# Patient Record
Sex: Female | Born: 1995 | Race: Black or African American | Hispanic: No | Marital: Single | Smoking: Current some day smoker
Health system: Southern US, Community
[De-identification: ages and names within clinical notes are randomized; demographics above are authoritative.]

## PROBLEM LIST (undated history)

## (undated) DIAGNOSIS — J45909 Unspecified asthma, uncomplicated: Secondary | ICD-10-CM

---

## 2019-08-19 ENCOUNTER — Other Ambulatory Visit: Payer: Self-pay | Admitting: Cardiology

## 2019-08-19 DIAGNOSIS — Z20822 Contact with and (suspected) exposure to covid-19: Secondary | ICD-10-CM

## 2019-08-20 LAB — NOVEL CORONAVIRUS, NAA: SARS-CoV-2, NAA: NOT DETECTED

## 2019-09-03 ENCOUNTER — Ambulatory Visit (HOSPITAL_COMMUNITY): Admission: EM | Admit: 2019-09-03 | Discharge: 2019-09-03 | Discharge status: Absent without leave | Payer: Self-pay

## 2019-10-13 ENCOUNTER — Other Ambulatory Visit: Payer: Self-pay

## 2019-10-13 ENCOUNTER — Emergency Department (HOSPITAL_COMMUNITY)
Admission: EM | Admit: 2019-10-13 | Discharge: 2019-10-13 | Disposition: A | Discharge status: Home / Self Care | Payer: 59 | Attending: Emergency Medicine | Admitting: Emergency Medicine

## 2019-10-13 ENCOUNTER — Encounter (HOSPITAL_COMMUNITY): Payer: Self-pay | Admitting: Emergency Medicine

## 2019-10-13 ENCOUNTER — Emergency Department (HOSPITAL_COMMUNITY): Admission: EM | Admit: 2019-10-13 | Discharge: 2019-10-13 | Discharge status: Against Medical Advice | Payer: 59

## 2019-10-13 DIAGNOSIS — F17210 Nicotine dependence, cigarettes, uncomplicated: Secondary | ICD-10-CM | POA: Diagnosis not present

## 2019-10-13 DIAGNOSIS — L03115 Cellulitis of right lower limb: Secondary | ICD-10-CM | POA: Diagnosis not present

## 2019-10-13 DIAGNOSIS — M79661 Pain in right lower leg: Secondary | ICD-10-CM | POA: Diagnosis present

## 2019-10-13 MED ORDER — CEPHALEXIN 500 MG PO CAPS: 500.0000 mg | ORAL_CAPSULE | Freq: Four times a day (QID) | ORAL | 0 refills | Status: AC

## 2019-10-13 MED ORDER — CEPHALEXIN 500 MG PO CAPS
500.0000 mg | ORAL_CAPSULE | Freq: Once | ORAL | Status: AC
  Administered 2019-10-13: 500 mg via ORAL
  Filled 2019-10-13: qty 1

## 2019-10-13 NOTE — Discharge Instructions (Addendum)
Take antibiotics as prescribed.  Take entire course, even if your symptoms improve.  You received your first dose of the antibiotic in the ER, your next dose will be tomorrow morning. Use Tylenol or ibuprofen as needed for pain. Return to the emergency room if you develop high fevers, persistent vomiting, severe worsening pain, symptoms are worsening despite antibiotic treatment after 48 hours, or with any new, worsening, or concerning symptoms.

## 2019-10-13 NOTE — ED Triage Notes (Signed)
Patient here from home reporting  redness and pain to right lower shin that started this week.  No drainage noted.

## 2019-10-13 NOTE — ED Provider Notes (Signed)
[facility] DEPT Provider Note   CSN: [id number] Arrival date & time: 10/13/19  0108     History Chief Complaint  Patient presents with  . Leg Pain    Ann Hickman is a 24 y.o. female presenting for evaluation of right shin pain and redness.  Patient states yesterday she noted some discomfort of her right shin.  Since then, she has had continued discomfort, describes it as a stinging and burning feeling.  She has some redness and swelling.  Patient states she has a scab that she consistently picks at on the shin that has been present for years.  However the discomfort and redness is new.  She denies known fevers, chills, nausea, vomiting, or generalized weakness.  No confusion.  She has no rash or discomfort elsewhere.  She has no other medical problems, takes medications daily.  She has been applying hydrogen peroxide and first-aid cream without improvement of symptoms.  She has not taken anything else.  HPI     History reviewed. No pertinent past medical history.  There are no problems to display for this patient.   History reviewed. No pertinent surgical history.   OB History   No obstetric history on file.     No family history on file.  Social History   Tobacco Use  . Smoking status: Current Some Day Smoker  . Smokeless tobacco: Never Used  Substance Use Topics  . Alcohol use: Yes  . Drug use: Yes    Types: Marijuana    Home Medications Prior to Admission medications   Medication Sig Start Date End Date Taking? Authorizing Provider  cephALEXin (KEFLEX) 500 MG capsule Take 1 capsule (500 mg total) by mouth 4 (four) times daily for 7 days. 10/13/19 10/20/19  Quanta Roher, PA-C    Allergies    Patient has no known allergies.  Review of Systems   Review of Systems  Skin: Positive for rash.  Neurological: Negative for weakness.    Physical Exam Updated Vital Signs BP 126/78 (BP Location: Right Arm)   Pulse 98   Temp (!)  100.4 F (38 C) (Oral)   Resp 19   SpO2 100%   Physical Exam Vitals and nursing note reviewed.  Constitutional:      General: She is not in acute distress.    Appearance: She is well-developed.     Comments: Resting comfortably in the bed in no acute distress  HENT:     Head: Normocephalic and atraumatic.  Eyes:     Conjunctiva/sclera: Conjunctivae normal.     Pupils: Pupils are equal, round, and reactive to light.  Cardiovascular:     Rate and Rhythm: Normal rate and regular rhythm.  Pulmonary:     Effort: Pulmonary effort is normal. No respiratory distress.     Breath sounds: Normal breath sounds. No wheezing.  Abdominal:     General: There is no distension.  Musculoskeletal:        General: Normal range of motion.     Cervical back: Normal range of motion and neck supple.  Skin:    General: Skin is warm and dry.     Capillary Refill: Capillary refill takes less than 2 seconds.     Findings: Erythema present.     Comments: Area of erythema and warmth of the anterior right shin.  Central area consistent with chronic scab.  No streaking.  Compartments are soft.  Pedal pulses 2+ bilaterally.  Neurological:     Mental Status:  She is alert and oriented to person, place, and time.     ED Results / Procedures / Treatments   Labs (all labs ordered are listed, but only abnormal results are displayed) Labs Reviewed - No data to display  EKG None  Radiology No results found.  Procedures Procedures (including critical care time)  Medications Ordered in ED Medications - No data to display  ED Course  I have reviewed the triage vital signs and the nursing notes.  Pertinent labs & imaging results that were available during my care of the patient were reviewed by me and considered in my medical decision making (see chart for details).    MDM Rules/Calculators/A&P                      Patient presenting for evaluation of right shin infection.  Physical exam shows  patient appears nontoxic.  She has mild fever of 100.4 upon arrival to the ED.  She does not appear septic.  Exam is consistent with cellulitis, likely due to skin as she has been picking.  Will treat with antibiotics.  At this time, patient appears safe for discharge.  Return precautions given.  Patient states she understands and agrees to plan.  Final Clinical Impression(s) / ED Diagnoses Final diagnoses:  Cellulitis of right lower extremity    Rx / DC Orders ED Discharge Orders         Ordered    cephALEXin (KEFLEX) 500 MG capsule  4 times daily     10/13/19 0158           Alveria Apley, PA-C 10/13/19 0159    Zadie Rhine, MD 10/13/19 [phone]

## 2019-10-13 NOTE — ED Triage Notes (Addendum)
Patient reports redness/tender with soreness at right lower shin this week , no drainage , denies fever or chills .

## 2019-10-17 ENCOUNTER — Encounter (HOSPITAL_COMMUNITY): Payer: Self-pay | Admitting: Emergency Medicine

## 2019-10-17 ENCOUNTER — Emergency Department (HOSPITAL_COMMUNITY): Payer: 59

## 2019-10-17 ENCOUNTER — Emergency Department (HOSPITAL_COMMUNITY)
Admission: EM | Admit: 2019-10-17 | Discharge: 2019-10-17 | Disposition: A | Discharge status: Home / Self Care | Payer: 59 | Attending: Emergency Medicine | Admitting: Emergency Medicine

## 2019-10-17 ENCOUNTER — Other Ambulatory Visit: Payer: Self-pay

## 2019-10-17 DIAGNOSIS — F419 Anxiety disorder, unspecified: Secondary | ICD-10-CM | POA: Diagnosis not present

## 2019-10-17 DIAGNOSIS — F17200 Nicotine dependence, unspecified, uncomplicated: Secondary | ICD-10-CM | POA: Insufficient documentation

## 2019-10-17 DIAGNOSIS — R0789 Other chest pain: Secondary | ICD-10-CM | POA: Insufficient documentation

## 2019-10-17 DIAGNOSIS — R079 Chest pain, unspecified: Secondary | ICD-10-CM

## 2019-10-17 DIAGNOSIS — J45909 Unspecified asthma, uncomplicated: Secondary | ICD-10-CM | POA: Diagnosis not present

## 2019-10-17 DIAGNOSIS — R0602 Shortness of breath: Secondary | ICD-10-CM | POA: Diagnosis not present

## 2019-10-17 DIAGNOSIS — R2243 Localized swelling, mass and lump, lower limb, bilateral: Secondary | ICD-10-CM | POA: Insufficient documentation

## 2019-10-17 DIAGNOSIS — R000 Tachycardia, unspecified: Secondary | ICD-10-CM | POA: Insufficient documentation

## 2019-10-17 LAB — CBC WITH DIFFERENTIAL/PLATELET
Abs Immature Granulocytes: 0.03 10*3/uL (ref 0.00–0.07)
Basophils Absolute: 0 10*3/uL (ref 0.0–0.1)
Basophils Relative: 0 %
Eosinophils Absolute: 0.2 10*3/uL (ref 0.0–0.5)
Eosinophils Relative: 1 %
HCT: 36.9 % (ref 36.0–46.0)
Hemoglobin: 11.1 g/dL — ABNORMAL LOW (ref 12.0–15.0)
Immature Granulocytes: 0 %
Lymphocytes Relative: 19 %
Lymphs Abs: 2.3 10*3/uL (ref 0.7–4.0)
MCH: 24.7 pg — ABNORMAL LOW (ref 26.0–34.0)
MCHC: 30.1 g/dL (ref 30.0–36.0)
MCV: 82.2 fL (ref 80.0–100.0)
Monocytes Absolute: 0.6 10*3/uL (ref 0.1–1.0)
Monocytes Relative: 5 %
Neutro Abs: 9.2 10*3/uL — ABNORMAL HIGH (ref 1.7–7.7)
Neutrophils Relative %: 75 %
Platelets: 309 10*3/uL (ref 150–400)
RBC: 4.49 MIL/uL (ref 3.87–5.11)
RDW: 14.8 % (ref 11.5–15.5)
WBC: 12.4 10*3/uL — ABNORMAL HIGH (ref 4.0–10.5)
nRBC: 0 % (ref 0.0–0.2)

## 2019-10-17 LAB — BASIC METABOLIC PANEL
Anion gap: 10 (ref 5–15)
BUN: 10 mg/dL (ref 6–20)
CO2: 24 mmol/L (ref 22–32)
Calcium: 9.1 mg/dL (ref 8.9–10.3)
Chloride: 102 mmol/L (ref 98–111)
Creatinine, Ser: 0.78 mg/dL (ref 0.44–1.00)
GFR calc Af Amer: >60 mL/min (ref >60)
GFR calc non Af Amer: >60 mL/min (ref >60)
Glucose, Bld: 126 mg/dL — ABNORMAL HIGH (ref 70–99)
Potassium: 4 mmol/L (ref 3.5–5.1)
Sodium: 136 mmol/L (ref 135–145)

## 2019-10-17 LAB — I-STAT BETA HCG BLOOD, ED (MC, WL, AP ONLY): I-stat hCG, quantitative: <5 m[IU]/mL (ref <5)

## 2019-10-17 LAB — D-DIMER, QUANTITATIVE: D-Dimer, Quant: 0.61 ug/mL-FEU — ABNORMAL HIGH (ref 0.00–0.50)

## 2019-10-17 LAB — TROPONIN I (HIGH SENSITIVITY): Troponin I (High Sensitivity): <2 ng/L (ref <18)

## 2019-10-17 MED ORDER — SODIUM CHLORIDE (PF) 0.9 % IJ SOLN
INTRAMUSCULAR | Status: AC
  Filled 2019-10-17: qty 50

## 2019-10-17 MED ORDER — IOHEXOL 350 MG/ML SOLN
100.0000 mL | Freq: Once | INTRAVENOUS | Status: AC | PRN
  Administered 2019-10-17: 100 mL via INTRAVENOUS

## 2019-10-17 NOTE — ED Provider Notes (Signed)
[facility]-EMERGENCY DEPT Provider Note   CSN: [id number] Arrival date & time: 10/17/19  0253     History Chief Complaint  Patient presents with  . Flank Pain    Ann Hickman is a 24 y.o. female.  The history is provided by the patient and medical records.   24 year old female with history of asthma, presenting to the ED with right-sided chest pain.  Reports this began last evening but has been intensifying since onset.  States whenever she takes a deep breath she feels like a sharp, gripping sensation in the right side of her chest only.  States this will relax some but never fully subsides.  She denies any radiation of pain into her right neck or right arm.  States she does feel mildly short of breath.  She is not had any cough, fever, or other infectious symptoms.  She denies any recent travel or prolonged immobilization.  She did previously have a Nexplanon, this was removed in January.  She denies any history of DVT or PE.  No prior cardiac history.  She is not a smoker.  Patient was recently seen here for possible skin infection of right shin, has been taking keflex as prescribed.  History reviewed. No pertinent past medical history.  There are no problems to display for this patient.   History reviewed. No pertinent surgical history.   OB History   No obstetric history on file.     History reviewed. No pertinent family history.  Social History   Tobacco Use  . Smoking status: Current Some Day Smoker  . Smokeless tobacco: Never Used  Substance Use Topics  . Alcohol use: Yes  . Drug use: Yes    Types: Marijuana    Home Medications Prior to Admission medications   Medication Sig Start Date End Date Taking? Authorizing Provider  cephALEXin (KEFLEX) 500 MG capsule Take 1 capsule (500 mg total) by mouth 4 (four) times daily for 7 days. 10/13/19 10/20/19  Caccavale, Sophia, PA-C    Allergies    Patient has no known allergies.  Review of Systems     Review of Systems  Cardiovascular: Positive for chest pain.  All other systems reviewed and are negative.   Physical Exam Updated Vital Signs BP (!) 127/91 (BP Location: Left Arm)   Pulse (!) 113   Temp 98.8 F (37.1 C) (Oral)   Resp 20   Ht 5\' 7"  (1.702 m)   Wt 111.1 kg   SpO2 100%   BMI 38.37 kg/m   Physical Exam Vitals and nursing note reviewed.  Constitutional:      Appearance: She is well-developed.  HENT:     Head: Normocephalic and atraumatic.  Eyes:     Conjunctiva/sclera: Conjunctivae normal.     Pupils: Pupils are equal, round, and reactive to light.  Cardiovascular:     Rate and Rhythm: Normal rate and regular rhythm.     Heart sounds: Normal heart sounds.  Pulmonary:     Effort: Pulmonary effort is normal.     Breath sounds: Normal breath sounds.  Chest:     Comments: Chest wall is nontender Abdominal:     General: Bowel sounds are normal.     Palpations: Abdomen is soft.  Musculoskeletal:        General: Normal range of motion.     Cervical back: Normal range of motion.     Comments: Scabbed over area of right shin, appears old, no noted erythema or warmth to  touch present Trace ankle edema bilaterally  Skin:    General: Skin is warm and dry.  Neurological:     Mental Status: She is alert and oriented to person, place, and time.  Psychiatric:     Comments: Tearful, appears anxious     ED Results / Procedures / Treatments   Labs (all labs ordered are listed, but only abnormal results are displayed) Labs Reviewed  CBC WITH DIFFERENTIAL/PLATELET - Abnormal; Notable for the following components:      Result Value   WBC 12.4 (*)    Hemoglobin 11.1 (*)    MCH 24.7 (*)    Neutro Abs 9.2 (*)    All other components within normal limits  BASIC METABOLIC PANEL - Abnormal; Notable for the following components:   Glucose, Bld 126 (*)    All other components within normal limits  D-DIMER, QUANTITATIVE (NOT AT [facility]) - Abnormal; Notable for the  following components:   D-Dimer, Quant 0.61 (*)    All other components within normal limits  I-STAT BETA HCG BLOOD, ED ([facility], [facility], [facility] ONLY)  TROPONIN I (HIGH SENSITIVITY)    EKG None  Radiology DG Chest 2 View  Result Date: 10/17/2019 CLINICAL DATA:  24 year old female with history of right-sided chest pain and tachycardia. EXAM: CHEST - 2 VIEW COMPARISON:  No priors. FINDINGS: Lung volumes are normal. No consolidative airspace disease. No pleural effusions. No pneumothorax. No pulmonary nodule or mass noted. Pulmonary vasculature and the cardiomediastinal silhouette are within normal limits. IMPRESSION: No radiographic evidence of acute cardiopulmonary disease. Electronically Signed   By: Trudie Reed M.D.   On: 10/17/2019 04:24   CT Angio Chest PE W and/or Wo Contrast  Result Date: 10/17/2019 CLINICAL DATA:  Shortness of breath, right pleuritic chest pain, tachycardia EXAM: CT ANGIOGRAPHY CHEST WITH CONTRAST TECHNIQUE: Multidetector CT imaging of the chest was performed using the standard protocol during bolus administration of intravenous contrast. Multiplanar CT image reconstructions and MIPs were obtained to evaluate the vascular anatomy. CONTRAST:  OMNIPAQUE IOHEXOL 350 MG/ML SOLN COMPARISON:  None. FINDINGS: Cardiovascular: Satisfactory opacification of the pulmonary arteries to the proximal segmental level. No evidence of pulmonary embolism. Normal heart size. No pericardial effusion. Mediastinum/Nodes: No adenopathy. Imaged thyroid is unremarkable. Esophagus is unremarkable. Lungs/Pleura: Central airways are patent. Possible trace right pleural effusion. Mild bibasilar atelectasis. No pneumothorax. Upper Abdomen: No acute abnormality. Musculoskeletal: No chest wall abnormality. No significant osseous finding. Review of the MIP images confirms the above findings. IMPRESSION: No evidence of acute pulmonary embolism. Mild bibasilar atelectasis.  Possible trace right pleural fluid.  Electronically Signed   By: Guadlupe Spanish M.D.   On: 10/17/2019 06:41    Procedures Procedures (including critical care time)  Medications Ordered in ED Medications - No data to display  ED Course  I have reviewed the triage vital signs and the nursing notes.  Pertinent labs & imaging results that were available during my care of the patient were reviewed by me and considered in my medical decision making (see chart for details).    MDM Rules/Calculators/A&P  24 y.o. F here with pleuritic right sided chest pain, worse with deep breathing and associated SOB.  She is afebrile, non-toxic.  She is tearful and appears a little anxious.  She is tachycardic and tachypneic but lung sounds are grossly clear.  Recently had nexplanon removed, no other form of birth control in use currently.  No hx of DVT or PE.  Will obtain EKG, screening labs including  d-dimer, and CXR.  5:52 AM Labs grossly reassuring aside from mildly elevated d-dimer.  CXR clear.  On reassessment, patient sleeping and in NAD.  Tachycardia has resolved, HR now within the 80's.  Will get CTA given elevated d-dimer.  If this is without acute findings, can likely discharge home.  This was discussed with patient, she acknowledged understanding and is comfortable with this plan.  CTA negative for acute PE.  Patient continues resting comfortably with stable vital signs on room air.  Feel she is stable for discharge home.  I recommended that she follow-up with her primary care doctor or the [facility] if any ongoing issues.  She was given copies of her results from today's visit for physician review if needed.  She will return here for any new or acute changes.  Final Clinical Impression(s) / ED Diagnoses Final diagnoses:  Chest pain in adult    Rx / DC Orders ED Discharge Orders    None       Larene Pickett, PA-C 10/17/19 3329    Ezequiel Essex, MD 10/17/19 [phone]

## 2019-10-17 NOTE — ED Triage Notes (Signed)
Patient complaining of right flank pain. Patient states it started last night.

## 2019-10-17 NOTE — Discharge Instructions (Signed)
Work-up today was reassuring.  No evidence of heart issue, blood clot, or infection. Results attached on back, Recommend to follow-up with your primary care doctor or student health center if any ongoing issues. Return here for any new/acute changes.

## 2020-06-08 IMAGING — CR DG CHEST 2V
2 series · 2 of 2 positions shown · non-contrast
Comparison: No priors.

CLINICAL DATA: 23-year-old female with history of right-sided chest
pain and tachycardia.

EXAM:
CHEST - 2 VIEW

[w chest pa]
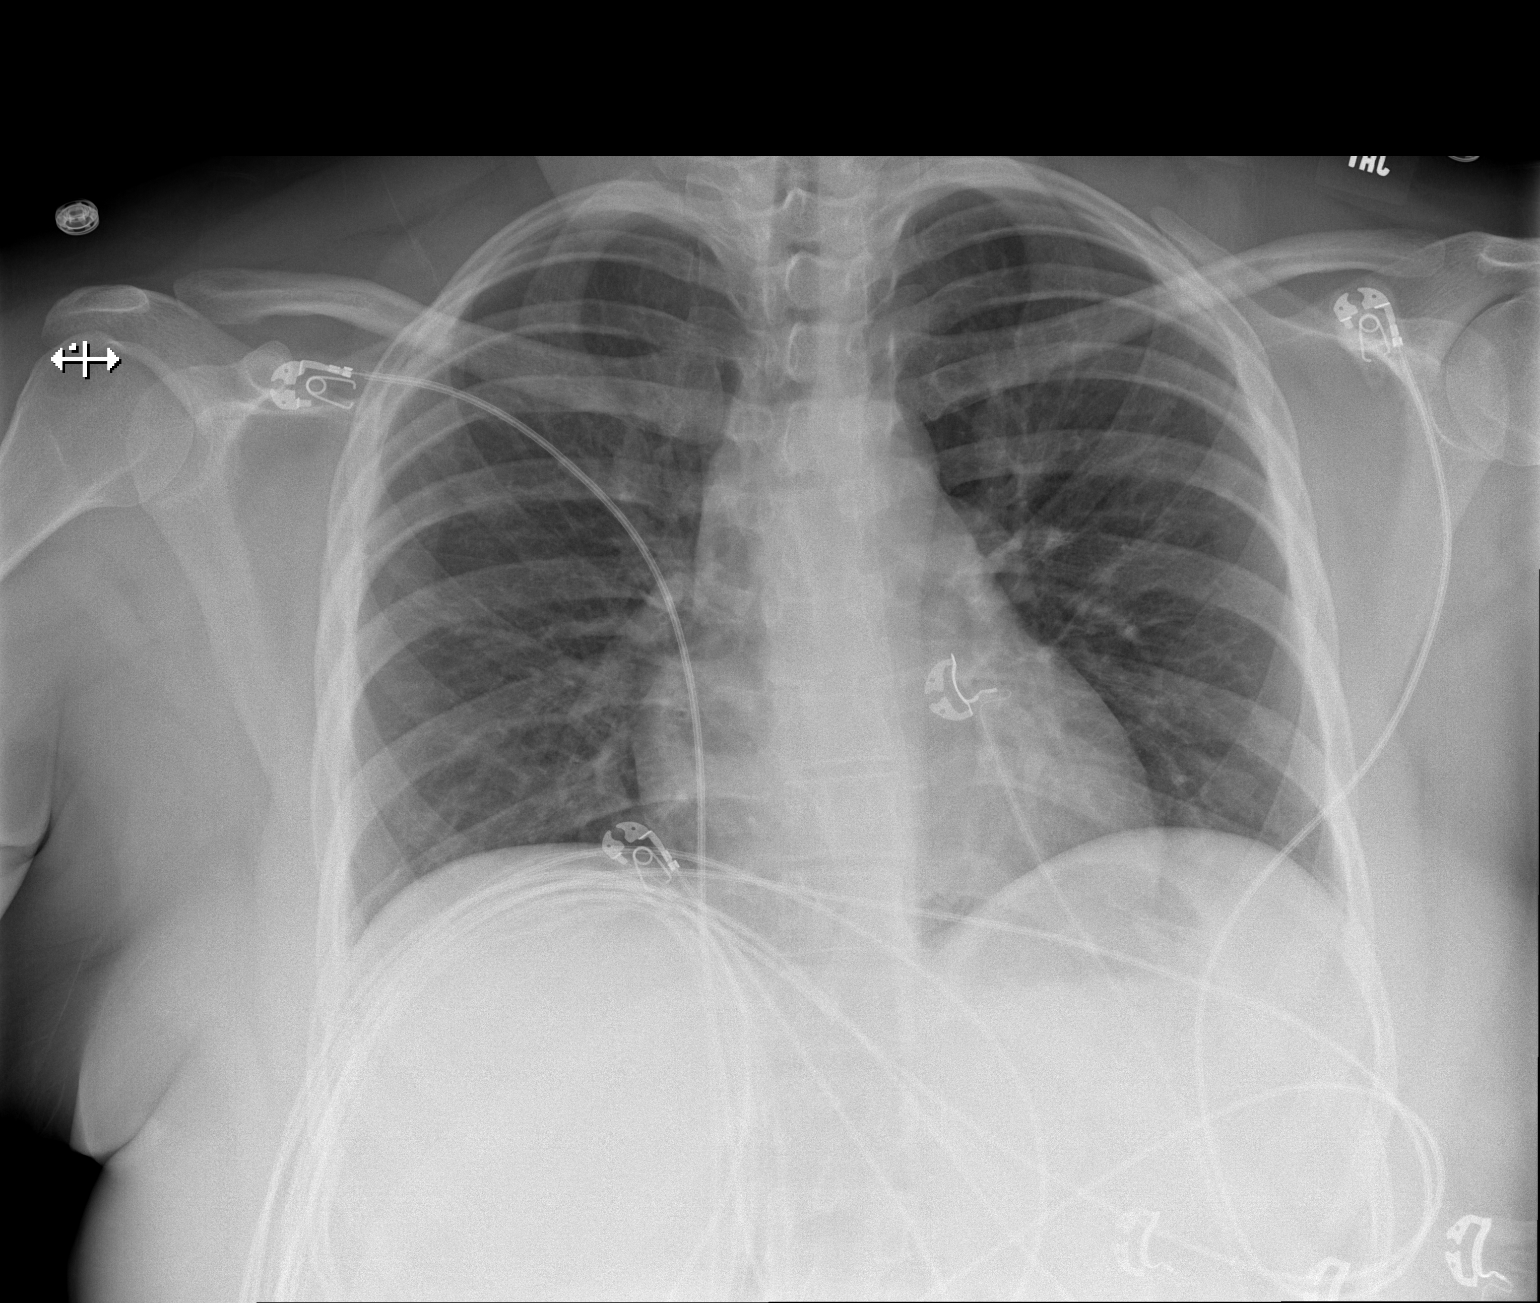

[w chest lat]
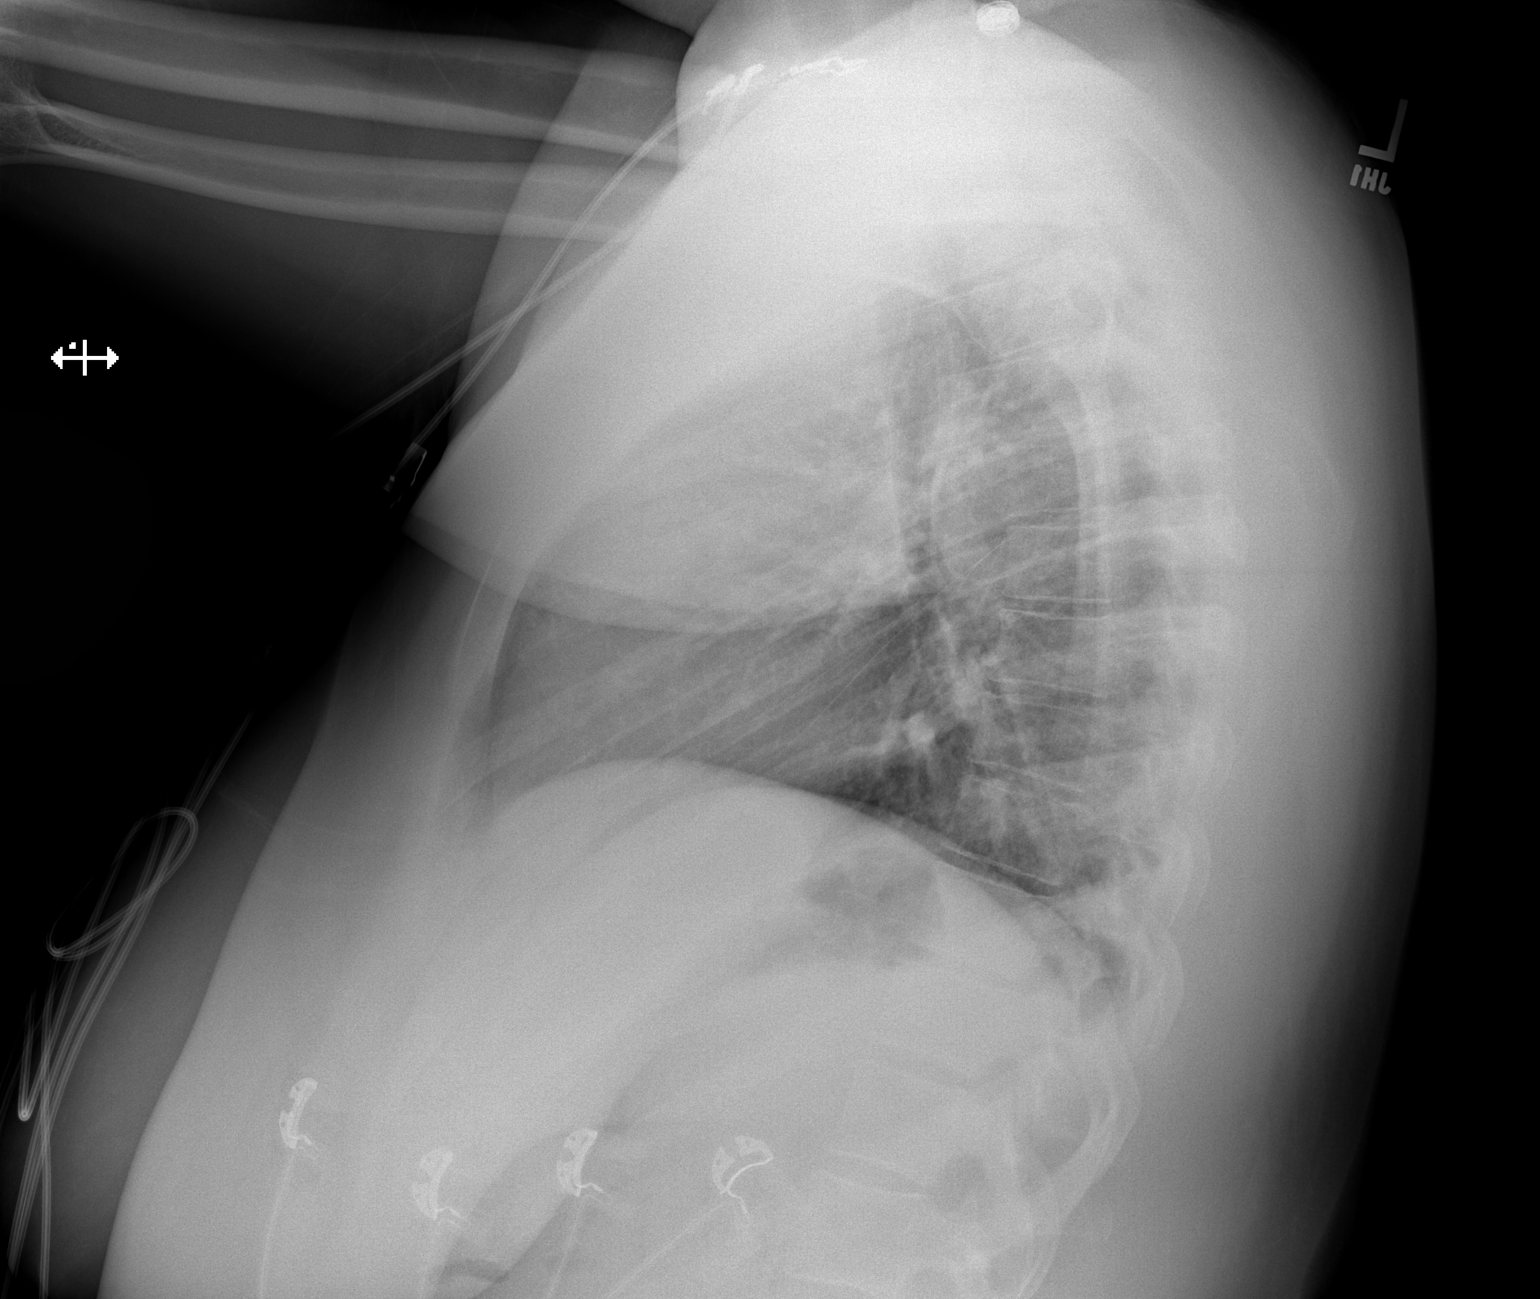

[2 of 2 positions shown; findings below may reference images not displayed]

FINDINGS: Lung volumes are normal. No consolidative airspace disease. No
pleural effusions. No pneumothorax. No pulmonary nodule or mass
noted. Pulmonary vasculature and the cardiomediastinal silhouette
are within normal limits.
IMPRESSION: No radiographic evidence of acute cardiopulmonary disease.

## 2020-09-08 ENCOUNTER — Inpatient Hospital Stay (HOSPITAL_COMMUNITY)
Admission: AD | Admit: 2020-09-08 | Discharge: 2020-09-08 | Disposition: A | Discharge status: Home / Self Care | Payer: 59 | Attending: Obstetrics & Gynecology | Admitting: Obstetrics & Gynecology

## 2020-09-08 ENCOUNTER — Other Ambulatory Visit: Payer: Self-pay

## 2020-09-08 ENCOUNTER — Encounter (HOSPITAL_COMMUNITY): Payer: Self-pay | Admitting: Obstetrics & Gynecology

## 2020-09-08 DIAGNOSIS — N939 Abnormal uterine and vaginal bleeding, unspecified: Secondary | ICD-10-CM | POA: Diagnosis not present

## 2020-09-08 DIAGNOSIS — Z3202 Encounter for pregnancy test, result negative: Secondary | ICD-10-CM

## 2020-09-08 HISTORY — DX: Unspecified asthma, uncomplicated: J45.909

## 2020-09-08 LAB — POCT PREGNANCY, URINE: Preg Test, Ur: NEGATIVE

## 2020-09-08 MED ORDER — NAPROXEN 500 MG PO TABS: 500.0000 mg | ORAL_TABLET | Freq: Two times a day (BID) | ORAL | 2 refills | Status: AC

## 2020-09-08 MED ORDER — TRANEXAMIC ACID 650 MG PO TABS: 1300.0000 mg | ORAL_TABLET | Freq: Three times a day (TID) | ORAL | 2 refills | Status: AC

## 2020-09-08 NOTE — MAU Note (Signed)
Dr. Macon Large  MSE pt and referred her to GYN clinic. And Rx given for bleeding.

## 2020-09-08 NOTE — Discharge Instructions (Signed)
Abnormal Uterine Bleeding  Abnormal uterine bleeding is unusual bleeding from the uterus. It includes bleeding after sex, or bleeding or spotting between menstrual periods. It may also include bleeding that is heavier than normal, menstrual periods that last longer than usual, or bleeding that occurs after menopause. Abnormal uterine bleeding can affect teenagers, women in their reproductive years, pregnant women, and women who have reached menopause. Common causes of abnormal uterine bleeding include:  Pregnancy.  Growths of tissue (polyps).  Benign tumors or growths in the uterus (fibroids). These are not cancer.  Infection.  Cancer.  Too much or too little of some hormones in the body (hormonal imbalances). Any type of abnormal bleeding should be checked by a health care provider. Many cases are minor and simple to treat, but others may be more serious. Treatment will depend on the cause and severity of the bleeding. Follow these instructions at home: Medicines  Take over-the-counter and prescription medicines only as told by your health care provider.  Tell your health care provider about other medicines that you take. You may be asked to stop taking aspirin or medicines that contain aspirin. These medicines can make bleeding worse.  If you were prescribed iron pills, take them as told by your health care provider. Iron pills help to replace iron that your body loses because of this condition. Managing constipation In cases of severe bleeding, you may be asked to increase your iron intake to treat anemia. This may cause constipation. To prevent or treat constipation, you may need to:  Drink enough fluid to keep your urine pale yellow.  Take over-the-counter or prescription medicines.  Eat foods that are high in fiber, such as beans, whole grains, and fresh fruits and vegetables.  Limit foods that are high in fat and processed sugars, such as fried or sweet foods. General  instructions  Monitor your condition for any changes.  Do not use tampons, douche, or have sex until your health care provider says these things are okay.  Change your pads often.  Get regular exams. This includes pelvic exams and cervical cancer screenings. ? It is up to you to get the results of any tests that are done. Ask your health care provider, or the department that is doing the tests, when your results will be ready.  Keep all follow-up visits as told by your health care provider. This is important. Contact a health care provider if you:  Have bleeding that lasts for more than 1 week.  Feel dizzy at times.  Feel nauseous or you vomit.  Feel light-headed or weak.  Notice any other changes that show that your condition is getting worse. Get help right away if you:  Pass out.  Have bleeding that soaks through a pad every hour.  Have pain in the abdomen.  Have a fever or chills.  Become sweaty or weak.  Pass large blood clots from your vagina. Summary  Abnormal uterine bleeding is unusual bleeding from the uterus.  Any type of abnormal bleeding should be evaluated by a health care provider. Many cases are minor and simple to treat, but others may be more serious.  Treatment will depend on the cause of the bleeding.  Get help right away if you pass out, you have bleeding that soaks through a pad every hour, or you pass large blood clots from your vagina. This information is not intended to replace advice given to you by your health care provider. Make sure you discuss any questions you   have with your health care provider. Document Revised: 04/06/2020 Document Reviewed: 06/02/2019 Elsevier Patient Education  2021 [employer]  

## 2020-09-08 NOTE — MAU Provider Note (Signed)
    Attending OB/GYN MAU Provider Note   First Provider Initiated Contact with Patient 09/08/20 1543      S Ms. Ann Hickman is a 24 y.o. non-pregnant female who presents to MAU today with complaint of vaginal bleeding x 3 weeks.  Has some small clots. Uses 2-3 tampons/pads a day. Denies any lightheadedness, dizziness, presyncopal symptoms.  Denies any abnormal vaginal discharge, fevers, chills, sweats, dysuria, nausea, vomiting, other GI or GU symptoms or other general symptoms.     Past Medical History:  Diagnosis Date  . Asthma     No past surgical history on file.  O BP 137/72   Pulse 90   Temp 98.3 F (36.8 C)   Resp 18   Ht 5\' 7"  (1.702 m)   Wt 107.5 kg   BMI 37.12 kg/m  VS reviewed, nursing note reviewed,  Constitutional: well developed, well nourished, no distress HEENT: normocephalic CV: normal rate Pulm: normal effort Abdomen: soft, NT Neuro: alert and oriented x 3 Skin: warm, dry Psych: affect normal  Results for orders placed or performed during the hospital encounter of 09/08/20 (from the past 24 hour(s))  Pregnancy, urine POC     Status: None   Collection Time: 09/08/20  3:16 PM  Result Value Ref Range   Preg Test, Ur NEGATIVE NEGATIVE    A Non pregnant female Medical screening exam complete Abnormal uterine bleeding  P Hemodynamically stable Talked about AUB evaluation and management, recommended outpatient GYN office follow up but she was given an option to go to [facility] ER. She opted to go to office. Discussed some management options for AUB; prescribed Naproxen and Lysteda for now until she is seen in the office.   Sent message to [id number] to schedule follow up for patient. Discharge from MAU in stable condition Patient given the option of transfer to [facility] for further evaluation or seek care in outpatient facility of choice List of options for follow-up given  Warning signs for worsening condition that would warrant emergency follow-up  discussed    [facility], MD 09/08/2020 3:51 PM

## 2020-09-08 NOTE — MAU Note (Signed)
Pt reports she has been having vaginal bleeding x 3 weeks. Passing quarter sized clots.   Small amount of cramping that stared yesterday. None today.

## 2020-09-22 ENCOUNTER — Ambulatory Visit: Payer: 59 | Admitting: Obstetrics and Gynecology
